# Patient Record
Sex: Female | Born: 2000 | Race: White | Hispanic: No | Marital: Single | Smoking: Never smoker
Health system: Southern US, Community
[De-identification: ages and names within clinical notes are randomized; demographics above are authoritative.]

---
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
## 2018-04-05 ENCOUNTER — Encounter: Payer: Self-pay | Admitting: Gynecology

## 2018-04-05 ENCOUNTER — Ambulatory Visit
Admission: EM | Admit: 2018-04-05 | Discharge: 2018-04-05 | Disposition: A | Discharge status: Home / Self Care | Attending: Family Medicine | Admitting: Family Medicine

## 2018-04-05 ENCOUNTER — Other Ambulatory Visit: Payer: Self-pay

## 2018-04-05 DIAGNOSIS — R509 Fever, unspecified: Secondary | ICD-10-CM

## 2018-04-05 DIAGNOSIS — R69 Illness, unspecified: Secondary | ICD-10-CM

## 2018-04-05 LAB — RAPID INFLUENZA A&B ANTIGENS (ARMC ONLY): INFLUENZA B (ARMC): NEGATIVE

## 2018-04-05 LAB — RAPID INFLUENZA A&B ANTIGENS: Influenza A (ARMC): NEGATIVE

## 2018-04-05 LAB — RAPID STREP SCREEN (MED CTR MEBANE ONLY): STREPTOCOCCUS, GROUP A SCREEN (DIRECT): NEGATIVE

## 2018-04-05 MED ORDER — OSELTAMIVIR PHOSPHATE 75 MG PO CAPS: 75.0000 mg | ORAL_CAPSULE | Freq: Two times a day (BID) | ORAL | 0 refills | Status: AC

## 2018-04-05 MED ORDER — AMOXICILLIN 500 MG PO TABS: 500.0000 mg | ORAL_TABLET | Freq: Two times a day (BID) | ORAL | 0 refills | Status: AC

## 2018-04-05 NOTE — Discharge Instructions (Addendum)
Medication as prescribed.  We will call regarding the culture.  Ibuprofen for fever and body aches.  Take care  Dr. Adriana Simasook

## 2018-04-05 NOTE — ED Triage Notes (Signed)
Patient c/o body ace/ sore throat/ nausea and fever of 102.

## 2018-04-05 NOTE — ED Provider Notes (Signed)
[facility]    CSN: [id number] Arrival date & time: 04/05/18  1023  History   Chief Complaint Fever, chills, body aches, sore throat  HPI  17 year old female presents with the above complaints.  Started abruptly yesterday.  Patient reports severe sore throat, fever, chills, body aches, nausea and vomiting.  She has taken NyQuil and ibuprofen without resolution.  Symptoms are severe.  Interfering with sleep.  No known exacerbating factors.  No reported sick contacts.  No other associated symptoms.  No other complaints.  Social hx reviewed as below. Social History Social History   Tobacco Use  . Smoking status: Never Smoker  . Smokeless tobacco: Never Used  Substance Use Topics  . Alcohol use: Never    Frequency: Never  . Drug use: Never     Allergies   Patient has no known allergies.   Review of Systems Review of Systems  Constitutional: Positive for chills and fever.  HENT: Positive for sore throat.   Gastrointestinal: Positive for nausea and vomiting.  Musculoskeletal:       Bodyaches.   Physical Exam Triage Vital Signs ED Triage Vitals  Enc Vitals Group     BP 04/05/18 1043 113/80     Pulse Rate 04/05/18 1043 (!) 139     Resp 04/05/18 1043 16     Temp 04/05/18 1043 (!) 101.8 F (38.8 C)     Temp Source 04/05/18 1043 Oral     SpO2 04/05/18 1043 100 %     Weight 04/05/18 1044 148 lb (67.1 kg)     Height 04/05/18 1044 5\' 7"  (1.702 m)     Head Circumference --      Peak Flow --      Pain Score 04/05/18 1044 7     Pain Loc --      Pain Edu? --      Excl. in GC? --    Updated Vital Signs BP 113/80 (BP Location: Left Arm)   Pulse (!) 139   Temp (!) 101.8 F (38.8 C) (Oral)   Resp 16   Ht 5\' 7"  (1.702 m)   Wt 67.1 kg   LMP 03/12/2018   SpO2 100%   BMI 23.18 kg/m   Visual Acuity Right Eye Distance:   Left Eye Distance:   Bilateral Distance:    Right Eye Near:   Left Eye Near:    Bilateral Near:     Physical Exam Vitals signs  and nursing note reviewed.  Constitutional:      Comments: Does not appear well but is in no acute distress.  HENT:     Head: Normocephalic and atraumatic.     Right Ear: Tympanic membrane normal.     Left Ear: Tympanic membrane normal.     Mouth/Throat:     Pharynx: Posterior oropharyngeal erythema present.     Tonsils: Tonsillar exudate present.  Cardiovascular:     Rate and Rhythm: Regular rhythm. Tachycardia present.  Pulmonary:     Effort: Pulmonary effort is normal.     Breath sounds: No wheezing, rhonchi or rales.  Neurological:     Mental Status: She is alert.  Psychiatric:        Mood and Affect: Mood normal.        Behavior: Behavior normal.    UC Treatments / Results  Labs (all labs ordered are listed, but only abnormal results are displayed) Labs Reviewed  RAPID INFLUENZA A&B ANTIGENS ([facility] ONLY)  RAPID STREP SCREEN ([facility]  ONLY)  CULTURE, GROUP A STREP [facility])    EKG None  Radiology No results found.  Procedures Procedures (including critical care time)  Medications Ordered in UC Medications - No data to display  Initial Impression / Assessment and Plan / UC Course  I have reviewed the triage vital signs and the nursing notes.  Pertinent labs & imaging results that were available during my care of the patient were reviewed by me and considered in my medical decision making (see chart for details).    17 year old female presents with febrile illness.  Her strep and flu test were negative.  Clinically, I am sure of whether this is strep pharyngitis with a negative rapid test versus influenza.  Could be the beginnings of mononucleosis but it is too early to test and give a reliable result.  Placing on amoxicillin and Tamiflu awaiting strep culture.  Final Clinical Impressions(s) / UC Diagnoses   Final diagnoses:  Febrile illness     Discharge Instructions     Medication as prescribed.  We will call regarding the culture.  Ibuprofen for  fever and body aches.  Take care  Dr. Adriana Simasook    ED Prescriptions    Medication Sig Dispense Auth. Provider   amoxicillin (AMOXIL) 500 MG tablet Take 1 tablet (500 mg total) by mouth 2 (two) times daily. 20 tablet Lalisa Kiehn G, DO   oseltamivir (TAMIFLU) 75 MG capsule Take 1 capsule (75 mg total) by mouth every 12 (twelve) hours. 10 capsule Tommie Samsook, Murl Golladay G, DO     Controlled Substance Prescriptions [place] Controlled Substance Registry consulted? Not Applicable   Tommie SamsCook, Anslee Micheletti G, DO 04/05/18 1134

## 2018-04-08 LAB — CULTURE, GROUP A STREP (THRC)
